# Patient Record
Sex: Female | Born: 1965 | Race: White | Hispanic: No | Marital: Married
Health system: Southern US, Community
[De-identification: ages and names within clinical notes are randomized; demographics above are authoritative.]

## PROBLEM LIST (undated history)

## (undated) DIAGNOSIS — E782 Mixed hyperlipidemia: Secondary | ICD-10-CM

## (undated) DIAGNOSIS — I10 Essential (primary) hypertension: Secondary | ICD-10-CM

## (undated) HISTORY — DX: Mixed hyperlipidemia: E78.2

## (undated) HISTORY — PX: BREAST BIOPSY: SHX20

## (undated) HISTORY — DX: Essential (primary) hypertension: I10

---

## 1998-12-25 ENCOUNTER — Other Ambulatory Visit: Admission: RE | Admit: 1998-12-25 | Discharge: 1998-12-25 | Payer: Self-pay | Admitting: Gynecology

## 2000-01-14 ENCOUNTER — Other Ambulatory Visit: Admission: RE | Admit: 2000-01-14 | Discharge: 2000-01-14 | Payer: Self-pay | Admitting: Gynecology

## 2005-02-17 ENCOUNTER — Other Ambulatory Visit: Admission: RE | Admit: 2005-02-17 | Discharge: 2005-02-17 | Payer: Self-pay | Admitting: Gynecology

## 2005-02-19 ENCOUNTER — Encounter: Admission: RE | Admit: 2005-02-19 | Discharge: 2005-02-19 | Payer: Self-pay | Admitting: Gynecology

## 2005-04-24 ENCOUNTER — Encounter: Admission: RE | Admit: 2005-04-24 | Discharge: 2005-04-24 | Payer: Self-pay | Admitting: Gynecology

## 2006-06-23 ENCOUNTER — Encounter: Admission: RE | Admit: 2006-06-23 | Discharge: 2006-06-23 | Payer: Self-pay | Admitting: Gynecology

## 2006-07-07 ENCOUNTER — Encounter: Admission: RE | Admit: 2006-07-07 | Discharge: 2006-07-07 | Payer: Self-pay | Admitting: Gynecology

## 2007-06-23 ENCOUNTER — Other Ambulatory Visit: Admission: RE | Admit: 2007-06-23 | Discharge: 2007-06-23 | Payer: Self-pay | Admitting: Gynecology

## 2007-06-27 ENCOUNTER — Encounter: Admission: RE | Admit: 2007-06-27 | Discharge: 2007-06-27 | Payer: Self-pay | Admitting: Gynecology

## 2008-06-27 ENCOUNTER — Encounter: Admission: RE | Admit: 2008-06-27 | Discharge: 2008-06-27 | Payer: Self-pay | Admitting: Family Medicine

## 2009-02-21 ENCOUNTER — Other Ambulatory Visit: Admission: RE | Admit: 2009-02-21 | Discharge: 2009-02-21 | Payer: Self-pay | Admitting: Family Medicine

## 2009-07-01 ENCOUNTER — Encounter: Admission: RE | Admit: 2009-07-01 | Discharge: 2009-07-01 | Payer: Self-pay | Admitting: Family Medicine

## 2010-07-04 ENCOUNTER — Encounter: Admission: RE | Admit: 2010-07-04 | Discharge: 2010-07-04 | Payer: Self-pay | Admitting: Family Medicine

## 2010-11-30 ENCOUNTER — Encounter: Payer: Self-pay | Admitting: Gynecology

## 2011-06-03 ENCOUNTER — Other Ambulatory Visit: Payer: Self-pay | Admitting: Family Medicine

## 2011-06-03 DIAGNOSIS — Z1231 Encounter for screening mammogram for malignant neoplasm of breast: Secondary | ICD-10-CM

## 2011-07-08 ENCOUNTER — Ambulatory Visit
Admission: RE | Admit: 2011-07-08 | Discharge: 2011-07-08 | Disposition: A | Discharge status: Home / Self Care | Payer: BC Managed Care – PPO | Source: Ambulatory Visit | Attending: Family Medicine | Admitting: Family Medicine

## 2011-07-08 DIAGNOSIS — Z1231 Encounter for screening mammogram for malignant neoplasm of breast: Secondary | ICD-10-CM

## 2012-03-23 ENCOUNTER — Other Ambulatory Visit: Payer: Self-pay | Admitting: Family Medicine

## 2012-03-23 ENCOUNTER — Other Ambulatory Visit (HOSPITAL_COMMUNITY)
Admission: RE | Admit: 2012-03-23 | Discharge: 2012-03-23 | Disposition: A | Discharge status: Home / Self Care | Payer: BC Managed Care – PPO | Source: Ambulatory Visit | Attending: Family Medicine | Admitting: Family Medicine

## 2012-03-23 DIAGNOSIS — Z0000 Encounter for general adult medical examination without abnormal findings: Secondary | ICD-10-CM | POA: Insufficient documentation

## 2012-07-20 ENCOUNTER — Other Ambulatory Visit: Payer: Self-pay | Admitting: Family Medicine

## 2012-07-20 DIAGNOSIS — Z1231 Encounter for screening mammogram for malignant neoplasm of breast: Secondary | ICD-10-CM

## 2012-08-12 ENCOUNTER — Ambulatory Visit
Admission: RE | Admit: 2012-08-12 | Discharge: 2012-08-12 | Disposition: A | Discharge status: Home / Self Care | Payer: BC Managed Care – PPO | Source: Ambulatory Visit | Attending: Family Medicine | Admitting: Family Medicine

## 2012-08-12 DIAGNOSIS — Z1231 Encounter for screening mammogram for malignant neoplasm of breast: Secondary | ICD-10-CM

## 2012-08-19 ENCOUNTER — Other Ambulatory Visit: Payer: Self-pay | Admitting: Family Medicine

## 2012-08-19 DIAGNOSIS — N63 Unspecified lump in unspecified breast: Secondary | ICD-10-CM

## 2012-08-26 ENCOUNTER — Ambulatory Visit
Admission: RE | Admit: 2012-08-26 | Discharge: 2012-08-26 | Disposition: A | Discharge status: Home / Self Care | Payer: BC Managed Care – PPO | Source: Ambulatory Visit | Attending: Family Medicine | Admitting: Family Medicine

## 2012-08-26 DIAGNOSIS — N63 Unspecified lump in unspecified breast: Secondary | ICD-10-CM

## 2013-09-15 ENCOUNTER — Other Ambulatory Visit: Payer: Self-pay

## 2013-09-15 DIAGNOSIS — Z1231 Encounter for screening mammogram for malignant neoplasm of breast: Secondary | ICD-10-CM

## 2013-10-13 ENCOUNTER — Ambulatory Visit: Payer: BC Managed Care – PPO

## 2014-02-01 ENCOUNTER — Encounter: Payer: Self-pay | Admitting: *Deleted

## 2014-05-18 ENCOUNTER — Ambulatory Visit
Admission: RE | Admit: 2014-05-18 | Discharge: 2014-05-18 | Disposition: A | Discharge status: Home / Self Care | Payer: PRIVATE HEALTH INSURANCE | Source: Ambulatory Visit

## 2014-05-18 ENCOUNTER — Encounter (INDEPENDENT_AMBULATORY_CARE_PROVIDER_SITE_OTHER): Payer: Self-pay

## 2014-05-18 DIAGNOSIS — Z1231 Encounter for screening mammogram for malignant neoplasm of breast: Secondary | ICD-10-CM

## 2015-11-21 ENCOUNTER — Other Ambulatory Visit: Payer: Self-pay

## 2015-11-21 DIAGNOSIS — Z1231 Encounter for screening mammogram for malignant neoplasm of breast: Secondary | ICD-10-CM

## 2015-12-16 ENCOUNTER — Ambulatory Visit
Admission: RE | Admit: 2015-12-16 | Discharge: 2015-12-16 | Disposition: A | Discharge status: Home / Self Care | Payer: PRIVATE HEALTH INSURANCE | Source: Ambulatory Visit

## 2015-12-16 DIAGNOSIS — Z1231 Encounter for screening mammogram for malignant neoplasm of breast: Secondary | ICD-10-CM

## 2015-12-19 ENCOUNTER — Other Ambulatory Visit: Payer: Self-pay | Admitting: Family Medicine

## 2015-12-19 DIAGNOSIS — R928 Other abnormal and inconclusive findings on diagnostic imaging of breast: Secondary | ICD-10-CM

## 2015-12-25 ENCOUNTER — Ambulatory Visit
Admission: RE | Admit: 2015-12-25 | Discharge: 2015-12-25 | Disposition: A | Discharge status: Home / Self Care | Payer: PRIVATE HEALTH INSURANCE | Source: Ambulatory Visit | Attending: Family Medicine | Admitting: Family Medicine

## 2015-12-25 ENCOUNTER — Other Ambulatory Visit: Payer: Self-pay | Admitting: Family Medicine

## 2015-12-25 DIAGNOSIS — R928 Other abnormal and inconclusive findings on diagnostic imaging of breast: Secondary | ICD-10-CM

## 2015-12-31 ENCOUNTER — Ambulatory Visit
Admission: RE | Admit: 2015-12-31 | Discharge: 2015-12-31 | Disposition: A | Discharge status: Home / Self Care | Payer: PRIVATE HEALTH INSURANCE | Source: Ambulatory Visit | Attending: Family Medicine | Admitting: Family Medicine

## 2015-12-31 ENCOUNTER — Other Ambulatory Visit: Payer: Self-pay | Admitting: Family Medicine

## 2015-12-31 DIAGNOSIS — R928 Other abnormal and inconclusive findings on diagnostic imaging of breast: Secondary | ICD-10-CM

## 2016-10-12 ENCOUNTER — Ambulatory Visit (INDEPENDENT_AMBULATORY_CARE_PROVIDER_SITE_OTHER): Payer: 59 | Admitting: Podiatry

## 2016-10-12 ENCOUNTER — Encounter: Payer: Self-pay | Admitting: Podiatry

## 2016-10-12 ENCOUNTER — Ambulatory Visit (INDEPENDENT_AMBULATORY_CARE_PROVIDER_SITE_OTHER): Payer: 59

## 2016-10-12 VITALS — BP 135/86 | HR 100 | Resp 16 | Ht 61.0 in | Wt 215.0 lb

## 2016-10-12 DIAGNOSIS — M79671 Pain in right foot: Secondary | ICD-10-CM

## 2016-10-12 DIAGNOSIS — M722 Plantar fascial fibromatosis: Secondary | ICD-10-CM | POA: Diagnosis not present

## 2016-10-12 MED ORDER — TRIAMCINOLONE ACETONIDE 10 MG/ML IJ SUSP
10.0000 mg | Freq: Once | INTRAMUSCULAR | Status: AC
  Administered 2016-10-12: 10 mg

## 2016-10-12 MED ORDER — DICLOFENAC SODIUM 75 MG PO TBEC: 75.0000 mg | DELAYED_RELEASE_TABLET | Freq: Two times a day (BID) | ORAL | 2 refills | Status: AC

## 2016-10-12 NOTE — Progress Notes (Signed)
   Subjective:    Patient ID: Kayla Burnett, female    DOB: 06/17/1966, 50 y.o.   MRN: [id number]  HPI Chief Complaint  Patient presents with  . Foot Pain    Right foot; heel; pt stated, "Hurts more in the morning; has had since May 2017"      Review of Systems  HENT: Positive for sneezing.   All other systems reviewed and are negative.      Objective:   Physical Exam        Assessment & Plan:

## 2016-10-12 NOTE — Patient Instructions (Signed)

## 2016-10-14 NOTE — Progress Notes (Signed)
Subjective:     Patient ID: Oilton NationSerena Burnett, female   DOB: 11/06/1966, 50 y.o.   MRN: [id number]  HPI patient states I'm having a lot of pain in the bottom of my right heel and it's becoming gradually more intense   Review of Systems  All other systems reviewed and are negative.      Objective:   Physical Exam  Constitutional: She is oriented to person, place, and time.  Cardiovascular: Intact distal pulses.   Musculoskeletal: Normal range of motion.  Neurological: She is oriented to person, place, and time.  Skin: Skin is warm.  Nursing note and vitals reviewed.  Neurovascular status intact muscle strength adequate range of motion within normal limits with patient found to have exquisite inflammation plantar aspect right heel at the insertional point of the tendon into the calcaneus    Assessment:     Acute plantar fasciitis right with chronic nature to condition    Plan:     H&P x-rays reviewed and injected the right plantar fascia 3 mg Kenalog 5 mg Xylocaine and instructed on physical therapy. Patient will be seen back to recheck again in 1 week and was given instructions on shoe gear modifications and had fascial brace dispensed  X-ray report indicates there small spur formation with no indication of stress fracture or arthritis

## 2016-10-21 ENCOUNTER — Encounter: Payer: Self-pay | Admitting: Podiatry

## 2016-10-21 ENCOUNTER — Ambulatory Visit (INDEPENDENT_AMBULATORY_CARE_PROVIDER_SITE_OTHER): Payer: 59 | Admitting: Podiatry

## 2016-10-21 DIAGNOSIS — M79671 Pain in right foot: Secondary | ICD-10-CM

## 2016-10-21 DIAGNOSIS — M722 Plantar fascial fibromatosis: Secondary | ICD-10-CM | POA: Diagnosis not present

## 2016-10-21 NOTE — Progress Notes (Signed)
Subjective:     Patient ID: Kayla NationSerena Coykendall, female   DOB: 06/08/1966, 50 y.o.   MRN: [id number]  HPI patient states the right foot is feeling quite good with minimal discomfort and able to walk without significant pain   Review of Systems     Objective:   Physical Exam Neurovascular status intact muscle strength adequate with discomfort in the right plantar foot that significantly improved from previous visit    Assessment:     Improved plantar fasciitis right    Plan:     H&P condition reviewed and recommended physical therapy anti-inflammatories supportive shoes and elevated heels. If symptoms persist we will need to consider more aggressive treatment and she will finish oral anti-inflammatory and today I spent a great deal time educating her on condition

## 2016-12-03 ENCOUNTER — Other Ambulatory Visit: Payer: Self-pay | Admitting: Family Medicine

## 2016-12-03 DIAGNOSIS — Z1231 Encounter for screening mammogram for malignant neoplasm of breast: Secondary | ICD-10-CM

## 2017-01-05 ENCOUNTER — Other Ambulatory Visit (HOSPITAL_COMMUNITY)
Admission: RE | Admit: 2017-01-05 | Discharge: 2017-01-05 | Disposition: A | Discharge status: Home / Self Care | Payer: Managed Care, Other (non HMO) | Source: Ambulatory Visit | Attending: Family Medicine | Admitting: Family Medicine

## 2017-01-05 ENCOUNTER — Other Ambulatory Visit: Payer: Self-pay | Admitting: Family Medicine

## 2017-01-05 ENCOUNTER — Ambulatory Visit
Admission: RE | Admit: 2017-01-05 | Discharge: 2017-01-05 | Disposition: A | Discharge status: Home / Self Care | Payer: Managed Care, Other (non HMO) | Source: Ambulatory Visit | Attending: Family Medicine | Admitting: Family Medicine

## 2017-01-05 DIAGNOSIS — Z01419 Encounter for gynecological examination (general) (routine) without abnormal findings: Secondary | ICD-10-CM | POA: Insufficient documentation

## 2017-01-05 DIAGNOSIS — Z1231 Encounter for screening mammogram for malignant neoplasm of breast: Secondary | ICD-10-CM

## 2017-01-06 LAB — CYTOLOGY - PAP: DIAGNOSIS: NEGATIVE

## 2017-11-24 ENCOUNTER — Other Ambulatory Visit: Payer: Self-pay | Admitting: Family Medicine

## 2017-11-24 DIAGNOSIS — Z139 Encounter for screening, unspecified: Secondary | ICD-10-CM

## 2018-01-06 ENCOUNTER — Ambulatory Visit
Admission: RE | Admit: 2018-01-06 | Discharge: 2018-01-06 | Disposition: A | Discharge status: Home / Self Care | Payer: Managed Care, Other (non HMO) | Source: Ambulatory Visit | Attending: Family Medicine | Admitting: Family Medicine

## 2018-01-06 DIAGNOSIS — Z139 Encounter for screening, unspecified: Secondary | ICD-10-CM

## 2018-11-24 ENCOUNTER — Other Ambulatory Visit: Payer: Self-pay | Admitting: Family Medicine

## 2018-11-24 DIAGNOSIS — Z1231 Encounter for screening mammogram for malignant neoplasm of breast: Secondary | ICD-10-CM

## 2019-01-18 ENCOUNTER — Ambulatory Visit
Admission: RE | Admit: 2019-01-18 | Discharge: 2019-01-18 | Disposition: A | Discharge status: Home / Self Care | Payer: PRIVATE HEALTH INSURANCE | Source: Ambulatory Visit | Attending: Family Medicine | Admitting: Family Medicine

## 2019-01-18 ENCOUNTER — Other Ambulatory Visit: Payer: Self-pay

## 2019-01-18 ENCOUNTER — Other Ambulatory Visit: Payer: Self-pay | Admitting: Family Medicine

## 2019-01-18 DIAGNOSIS — Z1231 Encounter for screening mammogram for malignant neoplasm of breast: Secondary | ICD-10-CM

## 2019-01-19 ENCOUNTER — Other Ambulatory Visit: Payer: Self-pay | Admitting: Family Medicine

## 2019-01-19 DIAGNOSIS — R928 Other abnormal and inconclusive findings on diagnostic imaging of breast: Secondary | ICD-10-CM

## 2019-01-25 ENCOUNTER — Other Ambulatory Visit: Payer: Self-pay

## 2019-01-25 ENCOUNTER — Ambulatory Visit
Admission: RE | Admit: 2019-01-25 | Discharge: 2019-01-25 | Disposition: A | Discharge status: Home / Self Care | Payer: PRIVATE HEALTH INSURANCE | Source: Ambulatory Visit | Attending: Family Medicine | Admitting: Family Medicine

## 2019-01-25 ENCOUNTER — Ambulatory Visit: Payer: PRIVATE HEALTH INSURANCE

## 2019-01-25 DIAGNOSIS — R928 Other abnormal and inconclusive findings on diagnostic imaging of breast: Secondary | ICD-10-CM

## 2019-12-18 ENCOUNTER — Other Ambulatory Visit: Payer: Self-pay | Admitting: Family Medicine

## 2019-12-18 DIAGNOSIS — Z1231 Encounter for screening mammogram for malignant neoplasm of breast: Secondary | ICD-10-CM

## 2020-01-24 ENCOUNTER — Other Ambulatory Visit: Payer: Self-pay

## 2020-01-24 ENCOUNTER — Ambulatory Visit
Admission: RE | Admit: 2020-01-24 | Discharge: 2020-01-24 | Disposition: A | Discharge status: Home / Self Care | Payer: PRIVATE HEALTH INSURANCE | Source: Ambulatory Visit | Attending: Family Medicine | Admitting: Family Medicine

## 2020-01-24 DIAGNOSIS — Z1231 Encounter for screening mammogram for malignant neoplasm of breast: Secondary | ICD-10-CM

## 2020-12-26 ENCOUNTER — Other Ambulatory Visit: Payer: Self-pay | Admitting: Family Medicine

## 2020-12-26 DIAGNOSIS — Z0000 Encounter for general adult medical examination without abnormal findings: Secondary | ICD-10-CM

## 2021-02-19 ENCOUNTER — Ambulatory Visit
Admission: RE | Admit: 2021-02-19 | Discharge: 2021-02-19 | Disposition: A | Discharge status: Home / Self Care | Payer: PRIVATE HEALTH INSURANCE | Source: Ambulatory Visit | Attending: Family Medicine | Admitting: Family Medicine

## 2021-02-19 ENCOUNTER — Other Ambulatory Visit: Payer: Self-pay

## 2021-02-19 DIAGNOSIS — Z0000 Encounter for general adult medical examination without abnormal findings: Secondary | ICD-10-CM

## 2022-01-20 ENCOUNTER — Other Ambulatory Visit: Payer: Self-pay | Admitting: Family Medicine

## 2022-01-20 DIAGNOSIS — Z1231 Encounter for screening mammogram for malignant neoplasm of breast: Secondary | ICD-10-CM

## 2022-02-25 ENCOUNTER — Ambulatory Visit
Admission: RE | Admit: 2022-02-25 | Discharge: 2022-02-25 | Disposition: A | Discharge status: Home / Self Care | Payer: PRIVATE HEALTH INSURANCE | Source: Ambulatory Visit | Attending: Family Medicine | Admitting: Family Medicine

## 2022-02-25 DIAGNOSIS — Z1231 Encounter for screening mammogram for malignant neoplasm of breast: Secondary | ICD-10-CM

## 2023-01-26 ENCOUNTER — Other Ambulatory Visit: Payer: Self-pay | Admitting: Family Medicine

## 2023-01-26 DIAGNOSIS — Z1231 Encounter for screening mammogram for malignant neoplasm of breast: Secondary | ICD-10-CM

## 2023-02-09 IMAGING — MG MM DIGITAL SCREENING BILAT W/ TOMO AND CAD
8 series · 8 of 24 positions shown · non-contrast
Comparison: Previous exam(s).

CLINICAL DATA: Screening.

EXAM:
DIGITAL SCREENING BILATERAL MAMMOGRAM WITH TOMOSYNTHESIS AND CAD
TECHNIQUE: Bilateral screening digital craniocaudal and mediolateral oblique
mammograms were obtained. Bilateral screening digital breast
tomosynthesis was performed. The images were evaluated with
computer-aided detection.

[R MLO synth-2D]
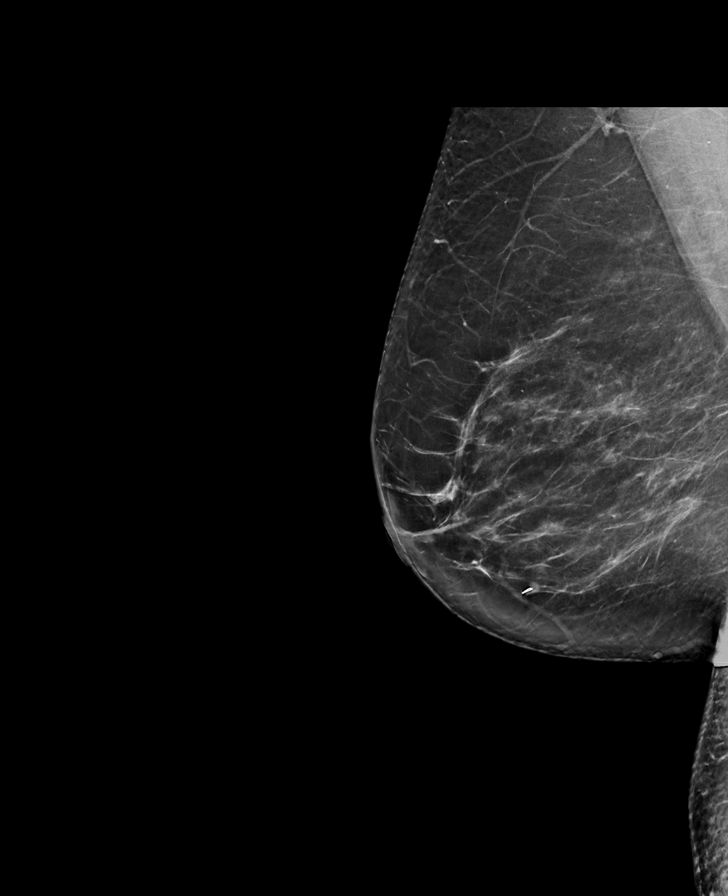

[L CC synth-2D]
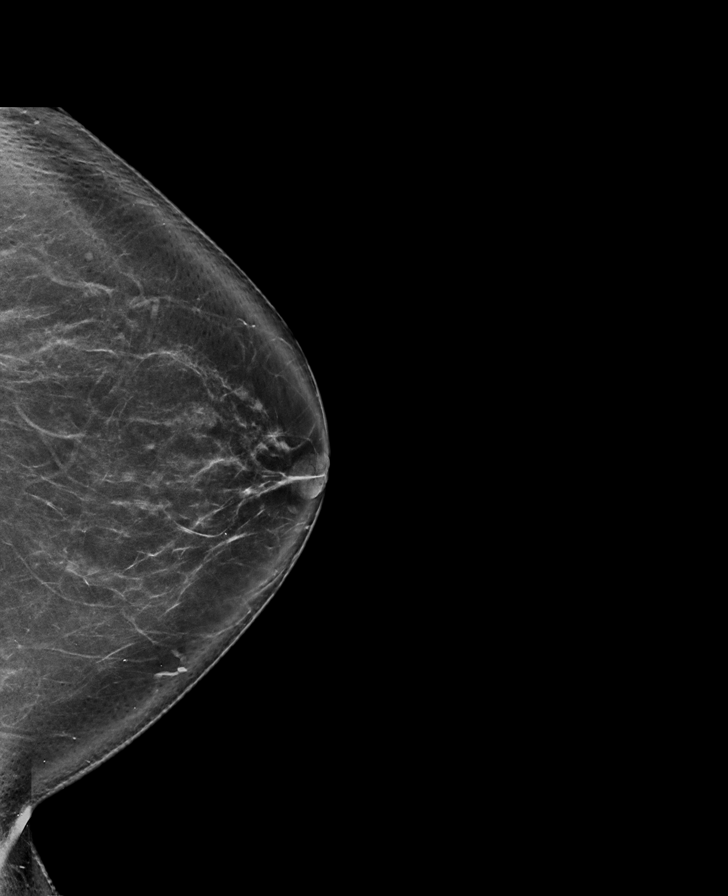

[L MLO synth-2D]
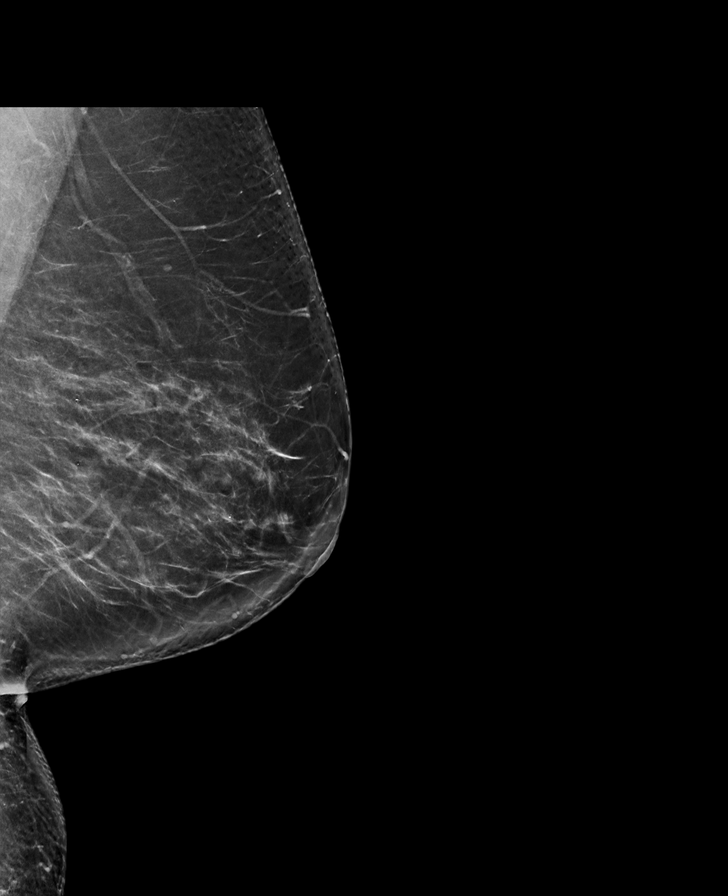

[R CC synth-2D]
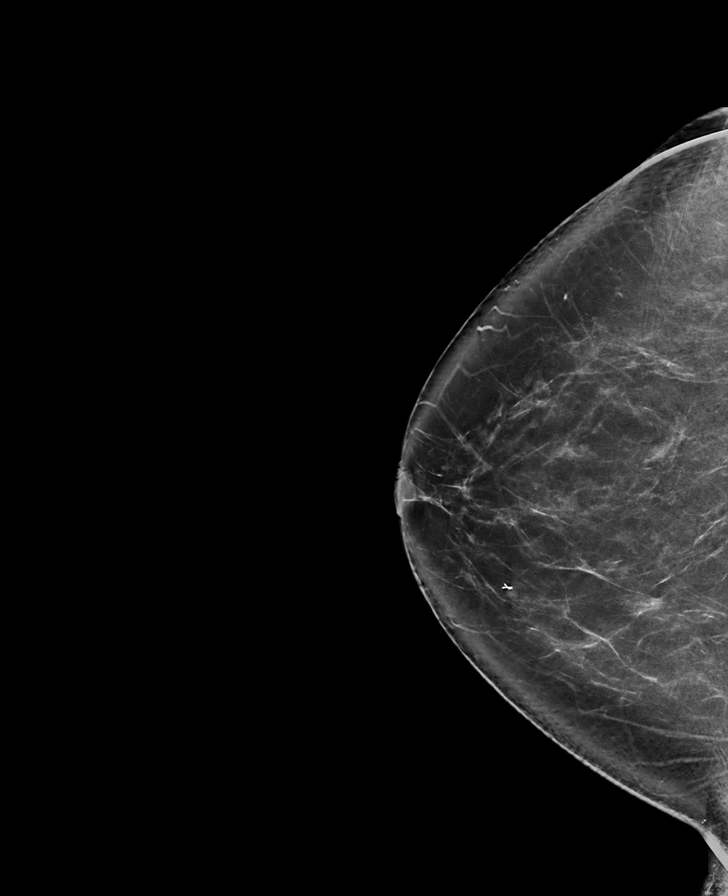

[L CC tomo · tomo slice 47/92.0]
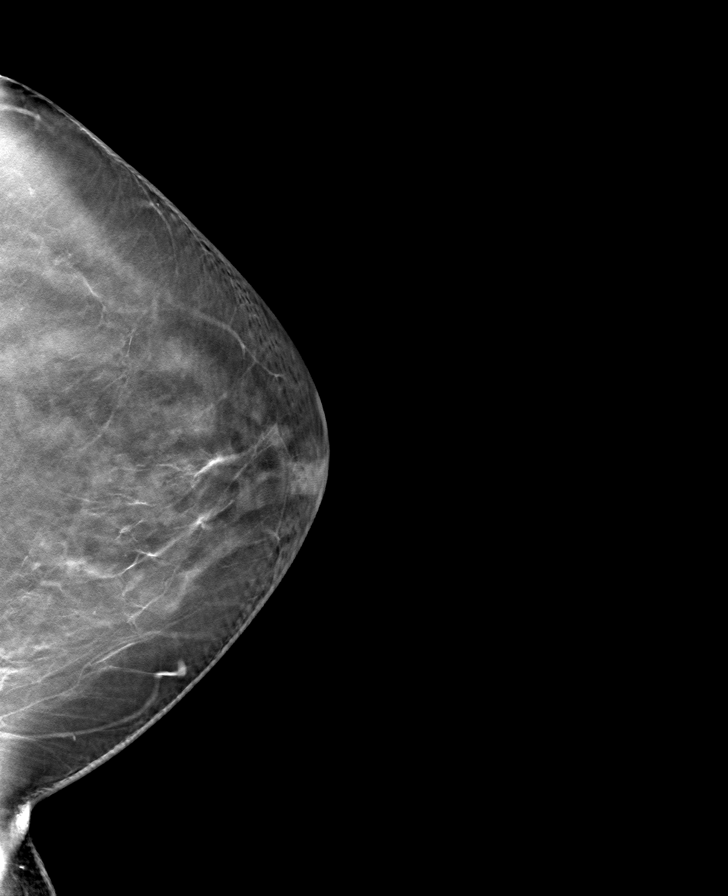

[R CC tomo · tomo slice 45/88.0]
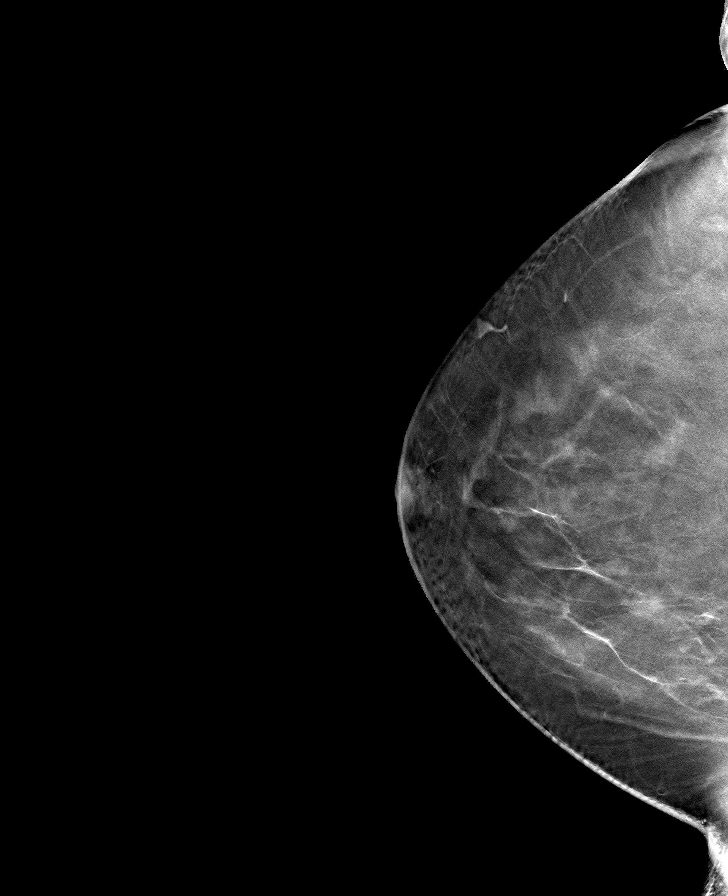

[L MLO tomo · tomo slice 44/87.0]
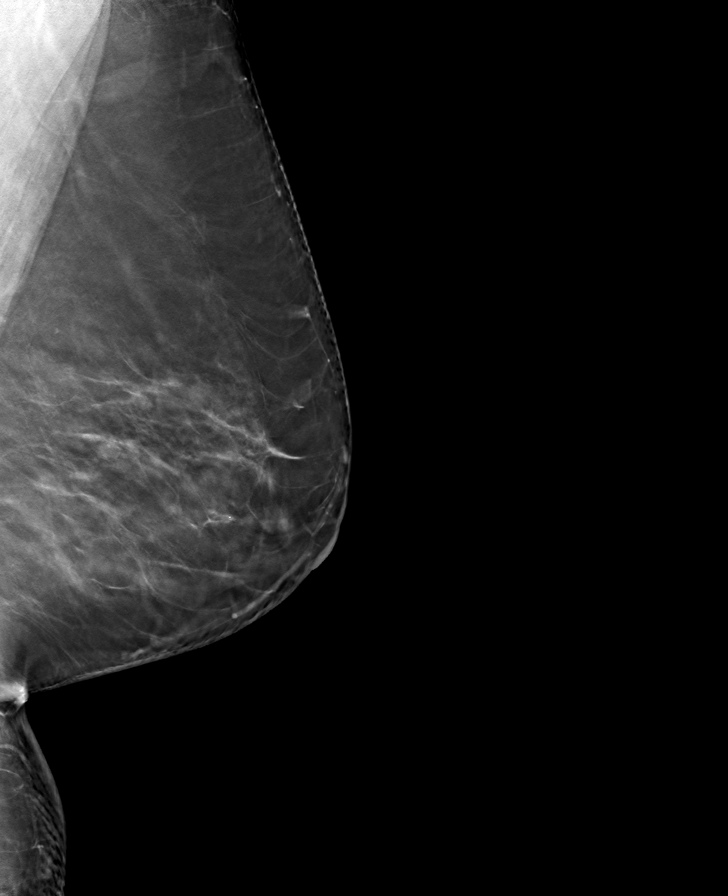

[R MLO tomo · tomo slice 44/87.0]
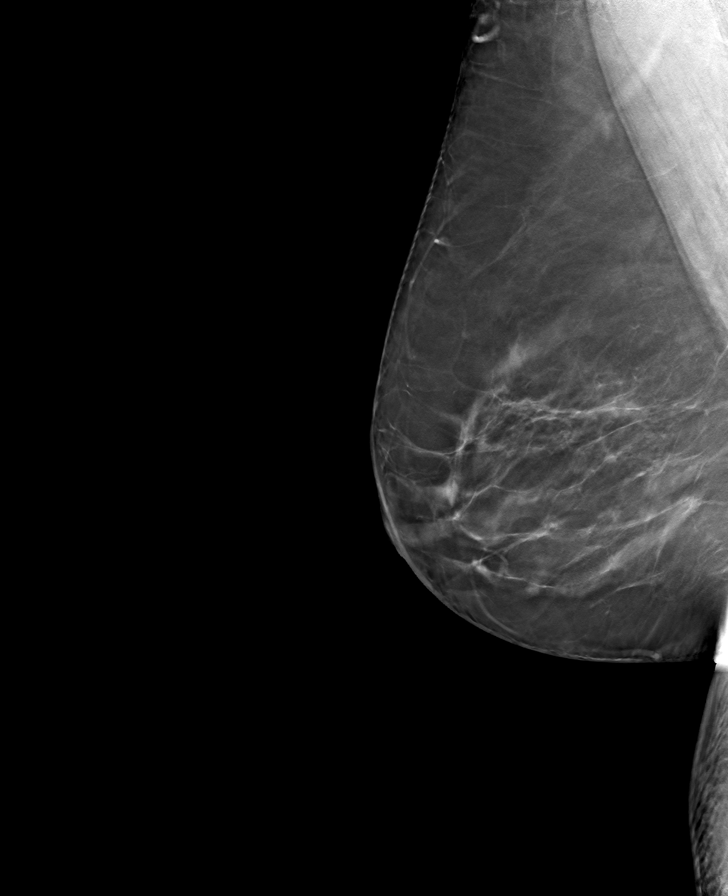

[8 of 24 positions shown; findings below may reference images not displayed]

ACR Breast Density Category b: There are scattered areas of
fibroglandular density.
FINDINGS: There are no findings suspicious for malignancy.
IMPRESSION: No mammographic evidence of malignancy. A result letter of this
screening mammogram will be mailed directly to the patient.

RECOMMENDATION:
Screening mammogram in one year. (Code:51-O-LD2)

BI-RADS CATEGORY  1: Negative.

## 2023-03-17 ENCOUNTER — Ambulatory Visit
Admission: RE | Admit: 2023-03-17 | Discharge: 2023-03-17 | Disposition: A | Discharge status: Home / Self Care | Payer: PRIVATE HEALTH INSURANCE | Source: Ambulatory Visit | Attending: Family Medicine | Admitting: Family Medicine

## 2023-03-17 DIAGNOSIS — Z1231 Encounter for screening mammogram for malignant neoplasm of breast: Secondary | ICD-10-CM

## 2024-01-26 ENCOUNTER — Other Ambulatory Visit: Payer: Self-pay | Admitting: Family Medicine

## 2024-01-26 DIAGNOSIS — Z1231 Encounter for screening mammogram for malignant neoplasm of breast: Secondary | ICD-10-CM

## 2024-03-17 ENCOUNTER — Ambulatory Visit
Admission: RE | Admit: 2024-03-17 | Discharge: 2024-03-17 | Disposition: A | Discharge status: Home / Self Care | Payer: PRIVATE HEALTH INSURANCE | Source: Ambulatory Visit | Attending: Family Medicine | Admitting: Family Medicine

## 2024-03-17 DIAGNOSIS — Z1231 Encounter for screening mammogram for malignant neoplasm of breast: Secondary | ICD-10-CM

## 2024-03-21 ENCOUNTER — Other Ambulatory Visit: Payer: Self-pay | Admitting: Family Medicine

## 2024-03-21 DIAGNOSIS — R928 Other abnormal and inconclusive findings on diagnostic imaging of breast: Secondary | ICD-10-CM

## 2024-04-05 ENCOUNTER — Ambulatory Visit
Admission: RE | Admit: 2024-04-05 | Discharge: 2024-04-05 | Disposition: A | Discharge status: Home / Self Care | Payer: PRIVATE HEALTH INSURANCE | Source: Ambulatory Visit | Attending: Family Medicine | Admitting: Family Medicine

## 2024-04-05 DIAGNOSIS — R928 Other abnormal and inconclusive findings on diagnostic imaging of breast: Secondary | ICD-10-CM
